# Patient Record
Sex: Female | Born: 1945 | Race: White | Hispanic: No | Marital: Married | Smoking: Former smoker
Health system: Southern US, Community
[De-identification: ages and names within clinical notes are randomized; demographics above are authoritative.]

## PROBLEM LIST (undated history)

## (undated) DIAGNOSIS — M8580 Other specified disorders of bone density and structure, unspecified site: Secondary | ICD-10-CM

## (undated) DIAGNOSIS — C50919 Malignant neoplasm of unspecified site of unspecified female breast: Secondary | ICD-10-CM

## (undated) DIAGNOSIS — I10 Essential (primary) hypertension: Secondary | ICD-10-CM

## (undated) DIAGNOSIS — M810 Age-related osteoporosis without current pathological fracture: Secondary | ICD-10-CM

## (undated) HISTORY — PX: BREAST LUMPECTOMY: SHX2

---

## 2011-10-18 ENCOUNTER — Emergency Department (HOSPITAL_BASED_OUTPATIENT_CLINIC_OR_DEPARTMENT_OTHER)
Admission: EM | Admit: 2011-10-18 | Discharge: 2011-10-18 | Disposition: A | Discharge status: Home / Self Care | Payer: Medicare Other | Attending: Emergency Medicine | Admitting: Emergency Medicine

## 2011-10-18 ENCOUNTER — Encounter (HOSPITAL_BASED_OUTPATIENT_CLINIC_OR_DEPARTMENT_OTHER): Payer: Self-pay | Admitting: *Deleted

## 2011-10-18 DIAGNOSIS — Z853 Personal history of malignant neoplasm of breast: Secondary | ICD-10-CM | POA: Insufficient documentation

## 2011-10-18 DIAGNOSIS — A088 Other specified intestinal infections: Secondary | ICD-10-CM | POA: Insufficient documentation

## 2011-10-18 DIAGNOSIS — A084 Viral intestinal infection, unspecified: Secondary | ICD-10-CM

## 2011-10-18 DIAGNOSIS — K089 Disorder of teeth and supporting structures, unspecified: Secondary | ICD-10-CM | POA: Insufficient documentation

## 2011-10-18 DIAGNOSIS — R10819 Abdominal tenderness, unspecified site: Secondary | ICD-10-CM | POA: Insufficient documentation

## 2011-10-18 DIAGNOSIS — R109 Unspecified abdominal pain: Secondary | ICD-10-CM | POA: Insufficient documentation

## 2011-10-18 DIAGNOSIS — J3489 Other specified disorders of nose and nasal sinuses: Secondary | ICD-10-CM | POA: Insufficient documentation

## 2011-10-18 DIAGNOSIS — R197 Diarrhea, unspecified: Secondary | ICD-10-CM | POA: Insufficient documentation

## 2011-10-18 DIAGNOSIS — R51 Headache: Secondary | ICD-10-CM | POA: Insufficient documentation

## 2011-10-18 DIAGNOSIS — R42 Dizziness and giddiness: Secondary | ICD-10-CM | POA: Insufficient documentation

## 2011-10-18 DIAGNOSIS — Z79899 Other long term (current) drug therapy: Secondary | ICD-10-CM | POA: Insufficient documentation

## 2011-10-18 DIAGNOSIS — R112 Nausea with vomiting, unspecified: Secondary | ICD-10-CM | POA: Insufficient documentation

## 2011-10-18 HISTORY — DX: Other specified disorders of bone density and structure, unspecified site: M85.80

## 2011-10-18 HISTORY — DX: Malignant neoplasm of unspecified site of unspecified female breast: C50.919

## 2011-10-18 LAB — COMPREHENSIVE METABOLIC PANEL
AST: 21 U/L (ref 0–37)
Albumin: 3.7 g/dL (ref 3.5–5.2)
Alkaline Phosphatase: 40 U/L (ref 39–117)
BUN: 14 mg/dL (ref 6–23)
CO2: 27 mEq/L (ref 19–32)
Chloride: 102 mEq/L (ref 96–112)
GFR calc non Af Amer: 75 mL/min — ABNORMAL LOW (ref >90)
Potassium: 3.4 mEq/L — ABNORMAL LOW (ref 3.5–5.1)
Total Bilirubin: 0.4 mg/dL (ref 0.3–1.2)

## 2011-10-18 LAB — URINALYSIS, ROUTINE W REFLEX MICROSCOPIC
Ketones, ur: NEGATIVE mg/dL
Leukocytes, UA: NEGATIVE
Nitrite: NEGATIVE
Protein, ur: NEGATIVE mg/dL
Urobilinogen, UA: 0.2 mg/dL (ref 0.0–1.0)

## 2011-10-18 LAB — CBC
MCH: 30.7 pg (ref 26.0–34.0)
MCV: 88.6 fL (ref 78.0–100.0)
Platelets: 236 10*3/uL (ref 150–400)
RDW: 13.9 % (ref 11.5–15.5)
WBC: 6.5 10*3/uL (ref 4.0–10.5)

## 2011-10-18 LAB — DIFFERENTIAL
Basophils Absolute: 0 10*3/uL (ref 0.0–0.1)
Eosinophils Absolute: 0 10*3/uL (ref 0.0–0.7)
Eosinophils Relative: 0 % (ref 0–5)

## 2011-10-18 MED ORDER — POTASSIUM CHLORIDE CRYS ER 20 MEQ PO TBCR
20.0000 meq | EXTENDED_RELEASE_TABLET | Freq: Once | ORAL | Status: AC
Start: 2011-10-18 — End: 2011-10-18
  Administered 2011-10-18: 20 meq via ORAL
  Filled 2011-10-18: qty 1

## 2011-10-18 MED ORDER — IBUPROFEN 200 MG PO TABS
200.0000 mg | ORAL_TABLET | Freq: Four times a day (QID) | ORAL | Status: DC | PRN
  Administered 2011-10-18: 200 mg via ORAL
  Filled 2011-10-18: qty 1

## 2011-10-18 MED ORDER — ONDANSETRON 4 MG PO TBDP: 4.0000 mg | ORAL_TABLET | Freq: Three times a day (TID) | ORAL | Status: DC | PRN

## 2011-10-18 MED ORDER — ONDANSETRON 4 MG PO TBDP: 4.0000 mg | ORAL_TABLET | Freq: Three times a day (TID) | ORAL | Status: AC | PRN

## 2011-10-18 MED ORDER — SODIUM CHLORIDE 0.9 % IV BOLUS (SEPSIS)
1000.0000 mL | Freq: Once | INTRAVENOUS | Status: AC
  Administered 2011-10-18: 1000 mL via INTRAVENOUS

## 2011-10-18 MED ORDER — ONDANSETRON HCL 4 MG/2ML IJ SOLN
4.0000 mg | Freq: Once | INTRAMUSCULAR | Status: AC
  Administered 2011-10-18: 4 mg via INTRAVENOUS
  Filled 2011-10-18: qty 2

## 2011-10-18 NOTE — Discharge Instructions (Signed)
1. Your symptoms are most likely caused by viral gastroenteritis. Please take Zofran for Nausea. 2. If you have worsening of your symptoms or new symptoms arise, please go to the ER immediately if symptoms are severe.  Diet for Diarrhea, Adult Having frequent, runny stools (diarrhea) has many causes. Diarrhea may be caused or worsened by food or drink. Diarrhea may be relieved by changing your diet. IF YOU ARE NOT TOLERATING SOLID FOODS:  Drink enough water and fluids to keep your urine clear or pale yellow.   Avoid sugary drinks and sodas as well as milk-based beverages.   Avoid beverages containing caffeine and alcohol.   You may try rehydrating beverages. You can make your own by following this recipe:    tsp table salt.    tsp baking soda.   ? tsp salt substitute (potassium chloride).   1 tbs + 1 tsp sugar.   1 qt water.  As your stools become more solid, you can start eating solid foods. Add foods one at a time. If a certain food causes your diarrhea to get worse, avoid that food and try other foods. A low fiber, low-fat, and lactose-free diet is recommended. Small, frequent meals may be better tolerated.  Starches  Allowed:  White, [place], and pita breads, plain rolls, buns, bagels. Plain muffins, matzo. Soda, saltine, or graham crackers. Pretzels, melba toast, zwieback. Cooked cereals made with water: cornmeal, farina, cream cereals. Dry cereals: refined corn, wheat, rice. Potatoes prepared any way without skins, refined macaroni, spaghetti, noodles, refined rice.   Avoid:  Bread, rolls, or crackers made with whole wheat, multi-grains, rye, bran seeds, nuts, or coconut. Corn tortillas or taco shells. Cereals containing whole grains, multi-grains, bran, coconut, nuts, or raisins. Cooked or dry oatmeal. Coarse wheat cereals, granola. Cereals advertised as "high-fiber." Potato skins. Whole grain pasta, wild or brown rice. Popcorn. Sweet potatoes/yams. Sweet rolls, doughnuts,  waffles, pancakes, sweet breads.  Vegetables  Allowed: Strained tomato and vegetable juices. Most well-cooked and canned vegetables without seeds. Fresh: Tender lettuce, cucumber without the skin, cabbage, spinach, bean sprouts.   Avoid: Fresh, cooked, or canned: Artichokes, baked beans, beet greens, broccoli, Brussels sprouts, corn, kale, legumes, peas, sweet potatoes. Cooked: Green or red cabbage, spinach. Avoid large servings of any vegetables, because vegetables shrink when cooked, and they contain more fiber per serving than fresh vegetables.  Fruit  Allowed: All fruit juices except prune juice. Cooked or canned: Apricots, applesauce, cantaloupe, cherries, fruit cocktail, grapefruit, grapes, kiwi, mandarin oranges, peaches, pears, plums, watermelon. Fresh: Apples without skin, ripe banana, grapes, cantaloupe, cherries, grapefruit, peaches, oranges, plums. Keep servings limited to  cup or 1 piece.   Avoid: Fresh: Apple with skin, apricots, mango, pears, raspberries, strawberries. Prune juice, stewed or dried prunes. Dried fruits, raisins, dates. Large servings of all fresh fruits.  Meat and Meat Substitutes  Allowed: Ground or well-cooked tender beef, ham, veal, lamb, pork, or poultry. Eggs, plain cheese. Fish, oysters, shrimp, lobster, other seafoods. Liver, organ meats.   Avoid: Tough, fibrous meats with gristle. Peanut butter, smooth or chunky. Cheese, nuts, seeds, legumes, dried peas, beans, lentils.  Milk  Allowed: Yogurt, lactose-free milk, kefir, drinkable yogurt, buttermilk, soy milk.   Avoid: Milk, chocolate milk, beverages made with milk, such as milk shakes.  Soups  Allowed: Bouillon, broth, or soups made from allowed foods. Any strained soup.   Avoid: Soups made from vegetables that are not allowed, cream or milk-based soups.  Desserts and Sweets  Allowed: Sugar-free gelatin, sugar-free frozen  ice pops made without sugar alcohol.   Avoid: Plain cakes and cookies, pie  made with allowed fruit, pudding, custard, cream pie. Gelatin, fruit, ice, sherbet, frozen ice pops. Ice cream, ice milk without nuts. Plain hard candy, honey, jelly, molasses, syrup, sugar, chocolate syrup, gumdrops, marshmallows.  Fats and Oils  Allowed: Avoid any fats and oils.   Avoid: Seeds, nuts, olives, avocados. Margarine, butter, cream, mayonnaise, salad oils, plain salad dressings made from allowed foods. Plain gravy, crisp bacon without rind.  Beverages  Allowed: Water, decaffeinated teas, oral rehydration solutions, sugar-free beverages.   Avoid: Fruit juices, caffeinated beverages (coffee, tea, soda or pop), alcohol, sports drinks, or lemon-lime soda or pop.  Condiments  Allowed: Ketchup, mustard, horseradish, vinegar, cream sauce, cheese sauce, cocoa powder. Spices in moderation: allspice, basil, bay leaves, celery powder or leaves, cinnamon, cumin powder, curry powder, ginger, mace, marjoram, onion or garlic powder, oregano, paprika, parsley flakes, ground pepper, rosemary, sage, savory, tarragon, thyme, turmeric.   Avoid: Coconut, honey.  Weight Monitoring: Weigh yourself every day. You should weigh yourself in the morning after you urinate and before you eat breakfast. Wear the same amount of clothing when you weigh yourself. Record your weight daily. Bring your recorded weights to your clinic visits. Tell your caregiver right away if you have gained 3 lb/1.4 kg or more in 1 day, 5 lb/2.3 kg in a week, or whatever amount you were told to report. SEEK IMMEDIATE MEDICAL CARE IF:   You are unable to keep fluids down.   You start to throw up (vomit) or diarrhea keeps coming back (persistent).   Abdominal pain develops, increases, or can be felt in one place (localizes).   You have an oral temperature above 102 F (38.9 C), not controlled by medicine.   Diarrhea contains blood or mucus.   You develop excessive weakness, dizziness, fainting, or extreme thirst.  MAKE SURE  YOU:   Understand these instructions.   Will watch your condition.   Will get help right away if you are not doing well or get worse.  Document Released: 10/10/2003 Document Revised: 07/09/2011 Document Reviewed: 01/31/2009 [facility] Patient Information 2012 [place], [place].

## 2011-10-18 NOTE — ED Notes (Signed)
Reports diarrhea ~ 15 times since 0100- n/v x 3 episodes- also c/o headache "under my eyes and in my teeth"

## 2011-10-18 NOTE — ED Notes (Signed)
MD at bedside. 

## 2011-10-18 NOTE — ED Provider Notes (Signed)
History     CSN: [id number]  Arrival date & time 10/18/11  1633   First MD Initiated Contact with Patient 10/18/11 1714      Chief Complaint  Patient presents with  . Diarrhea  . Emesis    (Consider location/radiation/quality/duration/timing/severity/associated sxs/prior treatment) HPI  Patient is 66 yo lady with PMH of breast cancer and s/p of lumpectomy in 2007, who presents with nausea, vomiting and abdominal pain. Per patient, her symptoms started at about 1:00 PM today. She vomited several times brownish materials without blood in it. She has watery diarrhea almost every hour. Her abdominal pain is mild and diffuse. She doses not have fever, but has chills. She has mild lightheadedness, no dizziness. Had one episode of palpitation.   Patient has chronic headache which is located under her eyes. She also has chronic sinus pain and pain in her front teeth. These two issues have not changed in nature recently.   Denies cough, chest pain, SOB, dysuria, urgency, frequency, hematuria, joint pain or leg swelling.   Past Medical History  Diagnosis Date  . Breast cancer   . Osteopenia     Past Surgical History  Procedure Date  . Breast lumpectomy     No family history on file.  History  Substance Use Topics  . Smoking status: Former Games developer  . Smokeless tobacco: Never Used  . Alcohol Use: Not on file    OB History    Grav Para Term Preterm Abortions TAB SAB Ect Mult Living                  Review of Systems  Constitutional: Positive for chills. Negative for fever, diaphoresis and fatigue.  HENT: Negative for ear pain, congestion, sore throat, facial swelling, rhinorrhea, sneezing, drooling, trouble swallowing, neck pain, voice change and ear discharge.   Eyes: Negative for pain, discharge and visual disturbance.  Respiratory: Negative for apnea, cough, choking, chest tightness, wheezing and stridor.   Cardiovascular: Negative for chest pain and leg swelling.    Gastrointestinal: Positive for nausea, vomiting, abdominal pain and diarrhea. Negative for constipation, blood in stool and abdominal distention.  Genitourinary: Negative for dysuria, urgency and frequency.  Musculoskeletal: Negative for back pain, joint swelling, arthralgias and gait problem.  Skin: Negative for pallor and wound.  Neurological: Positive for light-headedness and headaches. Negative for dizziness, tremors, seizures, syncope, facial asymmetry, weakness and numbness.  Hematological: Negative for adenopathy. Does not bruise/bleed easily.  Psychiatric/Behavioral: Negative for hallucinations, confusion and agitation.    Allergies  Review of patient's allergies indicates no known allergies.  Home Medications   Current Outpatient Rx  Name Route Sig Dispense Refill  . VITAMIN C PO Oral Take 1 tablet by mouth daily.    . ASPIRIN-ACETAMINOPHEN-CAFFEINE 250-250-65 MG PO TABS Oral Take 1 tablet by mouth every 6 (six) hours as needed. Patient used this medication for pain.    Marland Kitchen CINNAMON PO Oral Take 1 tablet by mouth daily.    . IBANDRONATE SODIUM 150 MG PO TABS Oral Take 150 mg by mouth every 30 (thirty) days. Take in the morning with a full glass of water, on an empty stomach, and do not take anything else by mouth or lie down for the next 30 min.    . IBUPROFEN 200 MG PO TABS Oral Take 200 mg by mouth every 6 (six) hours as needed. Patient used this medication for pain.    Marland Kitchen ONE-DAILY MULTI VITAMINS PO TABS Oral Take 1 tablet by mouth daily.    Marland Kitchen  OLIVE OIL OIL Topical Apply topically as needed.    Marland Kitchen FISH OIL PO Oral Take 1 capsule by mouth daily.    Marland Kitchen ONDANSETRON 4 MG PO TBDP Oral Take 1 tablet (4 mg total) by mouth every 8 (eight) hours as needed for nausea. 24 tablet 0    BP 129/73  Pulse 88  Temp(Src) 98.2 F (36.8 C) (Oral)  Resp 20  SpO2 96%  Physical Exam  General: resting in bed, not in acute distress HEENT: PERRL, EOMI, no scleral icterus. Mild tenderness over  maxillary area bilaterally.  Cardiac: S1/S2, RRR, No murmurs, gallops or rubs Pulm: Good air movement bilaterally, Clear to auscultation bilaterally, No rales, wheezing, rhonchi or rubs. Abd: Soft,  nondistended, mild diffused tenderness,  no rebound pain, no organomegaly, BS present Ext: No rashes or edema, 2+DP/PT pulse bilaterally Neuro: alert and oriented X3, cranial nerves II-XII grossly intact, muscle strength 5/5 in all extremeties,  sensation to light touch intact.   ED Course  Procedures (including critical care time)   Patient's symptoms are most likely caused by viral gastroenteritis. Patient was treated with IV bolus NS 1000 ml and Zofran for nausea. He feels better and wants to go home. Her CBC and UA do not have significant abnormalities. Her CMP showed K 3.4. Patient was given 20 mEq of KCl by mouth. She will be discharged home on Zofran. She is instructed to come back if symptoms get worse.    Labs Reviewed  COMPREHENSIVE METABOLIC PANEL - Abnormal; Notable for the following:    Potassium 3.4 (*)    Glucose, Bld 119 (*)    GFR calc non Af Amer 75 (*)    GFR calc Af Amer 86 (*)    All other components within normal limits  CBC - Abnormal; Notable for the following:    Hemoglobin 15.1 (*)    All other components within normal limits  DIFFERENTIAL - Abnormal; Notable for the following:    Neutrophils Relative 81 (*)    Lymphocytes Relative 10 (*)    All other components within normal limits  URINALYSIS, ROUTINE W REFLEX MICROSCOPIC - Abnormal; Notable for the following:    Hgb urine dipstick SMALL (*)    All other components within normal limits  URINE MICROSCOPIC-ADD ON - Abnormal; Notable for the following:    Squamous Epithelial / LPF FEW (*)    Bacteria, UA FEW (*)    All other components within normal limits   No results found.   1. Viral gastroenteritis       MDM     Lorretta Harp, MD 10/18/11 1931

## 2011-10-19 NOTE — ED Provider Notes (Signed)
I saw and evaluated the patient, reviewed the resident's note and I agree with the findings and plan. Pt w onset nvd. No abd tenderness. ivf in ed. Feels improved.   Suzi Roots, MD 10/19/11 [phone]

## 2016-01-31 ENCOUNTER — Encounter (HOSPITAL_BASED_OUTPATIENT_CLINIC_OR_DEPARTMENT_OTHER): Payer: Self-pay | Admitting: *Deleted

## 2016-01-31 ENCOUNTER — Emergency Department (HOSPITAL_BASED_OUTPATIENT_CLINIC_OR_DEPARTMENT_OTHER)
Admission: EM | Admit: 2016-01-31 | Discharge: 2016-01-31 | Disposition: A | Discharge status: Home / Self Care | Payer: Medicare Other | Attending: Emergency Medicine | Admitting: Emergency Medicine

## 2016-01-31 ENCOUNTER — Emergency Department (HOSPITAL_BASED_OUTPATIENT_CLINIC_OR_DEPARTMENT_OTHER): Payer: Medicare Other

## 2016-01-31 DIAGNOSIS — R197 Diarrhea, unspecified: Secondary | ICD-10-CM | POA: Diagnosis not present

## 2016-01-31 DIAGNOSIS — Z79899 Other long term (current) drug therapy: Secondary | ICD-10-CM | POA: Insufficient documentation

## 2016-01-31 DIAGNOSIS — I10 Essential (primary) hypertension: Secondary | ICD-10-CM | POA: Diagnosis not present

## 2016-01-31 DIAGNOSIS — R55 Syncope and collapse: Secondary | ICD-10-CM

## 2016-01-31 DIAGNOSIS — Z853 Personal history of malignant neoplasm of breast: Secondary | ICD-10-CM | POA: Insufficient documentation

## 2016-01-31 DIAGNOSIS — Z87891 Personal history of nicotine dependence: Secondary | ICD-10-CM | POA: Insufficient documentation

## 2016-01-31 DIAGNOSIS — R109 Unspecified abdominal pain: Secondary | ICD-10-CM | POA: Insufficient documentation

## 2016-01-31 HISTORY — DX: Essential (primary) hypertension: I10

## 2016-01-31 HISTORY — DX: Age-related osteoporosis without current pathological fracture: M81.0

## 2016-01-31 LAB — URINE MICROSCOPIC-ADD ON

## 2016-01-31 LAB — URINALYSIS, ROUTINE W REFLEX MICROSCOPIC
Bilirubin Urine: NEGATIVE
Glucose, UA: NEGATIVE mg/dL
Ketones, ur: NEGATIVE mg/dL
Leukocytes, UA: NEGATIVE
Nitrite: NEGATIVE
Protein, ur: NEGATIVE mg/dL
Specific Gravity, Urine: 1.01 (ref 1.005–1.030)
pH: 6 (ref 5.0–8.0)

## 2016-01-31 LAB — D-DIMER, QUANTITATIVE (NOT AT ARMC): D DIMER QUANT: 0.27 ug{FEU}/mL (ref 0.00–0.50)

## 2016-01-31 LAB — COMPREHENSIVE METABOLIC PANEL
ALK PHOS: 38 U/L (ref 38–126)
ALT: 18 U/L (ref 14–54)
AST: 27 U/L (ref 15–41)
Albumin: 4.2 g/dL (ref 3.5–5.0)
Anion gap: 10 (ref 5–15)
BILIRUBIN TOTAL: 0.7 mg/dL (ref 0.3–1.2)
BUN: 19 mg/dL (ref 6–20)
CALCIUM: 9.3 mg/dL (ref 8.9–10.3)
CO2: 25 mmol/L (ref 22–32)
CREATININE: 1.01 mg/dL — AB (ref 0.44–1.00)
Chloride: 104 mmol/L (ref 101–111)
GFR, EST NON AFRICAN AMERICAN: 55 mL/min — AB (ref >60)
Glucose, Bld: 102 mg/dL — ABNORMAL HIGH (ref 65–99)
Potassium: 3.3 mmol/L — ABNORMAL LOW (ref 3.5–5.1)
Sodium: 139 mmol/L (ref 135–145)
TOTAL PROTEIN: 7.4 g/dL (ref 6.5–8.1)

## 2016-01-31 LAB — CBC WITH DIFFERENTIAL/PLATELET
BASOS ABS: 0 10*3/uL (ref 0.0–0.1)
BASOS PCT: 0 %
EOS ABS: 0.1 10*3/uL (ref 0.0–0.7)
Eosinophils Relative: 1 %
HEMATOCRIT: 44.3 % (ref 36.0–46.0)
Hemoglobin: 15.1 g/dL — ABNORMAL HIGH (ref 12.0–15.0)
Lymphocytes Relative: 13 %
Lymphs Abs: 1.4 10*3/uL (ref 0.7–4.0)
MCH: 31.2 pg (ref 26.0–34.0)
MCHC: 34.1 g/dL (ref 30.0–36.0)
MCV: 91.5 fL (ref 78.0–100.0)
MONO ABS: 0.8 10*3/uL (ref 0.1–1.0)
MONOS PCT: 8 %
NEUTROS ABS: 7.9 10*3/uL — AB (ref 1.7–7.7)
NEUTROS PCT: 78 %
Platelets: 218 10*3/uL (ref 150–400)
RBC: 4.84 MIL/uL (ref 3.87–5.11)
RDW: 13.5 % (ref 11.5–15.5)
WBC: 10.2 10*3/uL (ref 4.0–10.5)

## 2016-01-31 LAB — TROPONIN I

## 2016-01-31 LAB — CBG MONITORING, ED: Glucose-Capillary: 113 mg/dL — ABNORMAL HIGH (ref 65–99)

## 2016-01-31 MED ORDER — SODIUM CHLORIDE 0.9 % IV BOLUS (SEPSIS)
1000.0000 mL | Freq: Once | INTRAVENOUS | Status: AC
  Administered 2016-01-31: 1000 mL via INTRAVENOUS

## 2016-01-31 MED ORDER — SODIUM CHLORIDE 0.9 % IV SOLN: INTRAVENOUS | Status: DC

## 2016-01-31 NOTE — Discharge Instructions (Signed)
Syncope °Syncope means a person passes out (faints). The person usually wakes up in less than 5 minutes. It is important to seek medical care for syncope. °HOME CARE °· Have someone stay with you until you feel normal. °· Do not drive, use machines, or play sports until your doctor says it is okay. °· Keep all doctor visits as told. °· Lie down when you feel like you might pass out. Take deep breaths. Wait until you feel normal before standing up. °· Drink enough fluids to keep your pee (urine) clear or pale yellow. °· If you take blood pressure or heart medicine, get up slowly. Take several minutes to sit and then stand. °GET HELP RIGHT AWAY IF:  °· You have a severe headache. °· You have pain in the chest, belly (abdomen), or back. °· You are bleeding from the mouth or butt (rectum). °· You have black or tarry poop (stool). °· You have an irregular or very fast heartbeat. °· You have pain with breathing. °· You keep passing out, or you have shaking (seizures) when you pass out. °· You pass out when sitting or lying down. °· You feel confused. °· You have trouble walking. °· You have severe weakness. °· You have vision problems. °If you fainted, call for help (911 in [place] Do not drive yourself to the hospital. °  °This information is not intended to replace advice given to you by your health care provider. Make sure you discuss any questions you have with your health care provider. °  °Document Released: 01/06/2008 Document Revised: 12/04/2014 Document Reviewed: 09/18/2011 °Elsevier Interactive Patient Education ©2016 [employer]vier Inc. ° °

## 2016-01-31 NOTE — ED Notes (Signed)
Family at bedside. 

## 2016-01-31 NOTE — ED Notes (Signed)
Pt walked to bathroom and back with no reports of dizziness , steady gait.

## 2016-01-31 NOTE — ED Notes (Signed)
Patient transported to CT 

## 2016-01-31 NOTE — ED Provider Notes (Signed)
CSN: [id number]     Arrival date & time 01/31/16  1441 History   First MD Initiated Contact with Patient 01/31/16 1454     Chief Complaint  Patient presents with  . Near Syncope  PT IS A 70 YO WF HERE FROM [place] VISITING RELATIVES.  THE PT SAID THAT SHE HAD JUST ARRIVED AFTER DRIVING FOR ABOUT 2 HRS.  SHE WAS IN THE KITCHEN TALKING TO HER FAMILY, FELT SUDDEN ABDOMINAL PAIN AND DIZZINESS.  SHE MAY HAVE PASSED OUT FOR A FEW SECONDS.  AFTERWARDS, SHE WENT TO THE BATHROOM AND HAD A LOT OF DIARRHEA.  PT'S SBP WAS LOW WHEN EMS ARRIVED.  PT FEELS BETTER NOW.  NO MORE ABDOMINAL PAIN.   (Consider location/radiation/quality/duration/timing/severity/associated sxs/prior Treatment) Patient is a 70 y.o. female presenting with near-syncope. The history is provided by the patient.  Near Syncope This is a new problem. The current episode started less than 1 hour ago. The problem has been rapidly improving. Associated symptoms include abdominal pain. Nothing aggravates the symptoms.    Past Medical History  Diagnosis Date  . Breast cancer ([place])   . Osteopenia   . Hypertension   . Osteoporosis    Past Surgical History  Procedure Laterality Date  . Breast lumpectomy     No family history on file. Social History  Substance Use Topics  . Smoking status: Former Research scientist (life sciences)  . Smokeless tobacco: Never Used  . Alcohol Use: 0.6 oz/week    1 Glasses of wine per week   OB History    No data available     Review of Systems  Cardiovascular: Positive for near-syncope.  Gastrointestinal: Positive for abdominal pain and diarrhea.  Neurological: Positive for dizziness and syncope.  All other systems reviewed and are negative.     Allergies  Sulfa antibiotics  Home Medications   Prior to Admission medications   Medication Sig Start Date End Date Taking? Authorizing Provider  losartan-hydrochlorothiazide (HYZAAR) 50-12.5 MG tablet Take 1 tablet by mouth daily.   Yes Historical Provider, MD   Ascorbic Acid (VITAMIN C PO) Take 1 tablet by mouth daily.    Historical Provider, MD  aspirin-acetaminophen-caffeine (EXCEDRIN MIGRAINE) [phone] MG per tablet Take 1 tablet by mouth every 6 (six) hours as needed. Patient used this medication for pain.    Historical Provider, MD  CINNAMON PO Take 1 tablet by mouth daily.    Historical Provider, MD  ibandronate (BONIVA) 150 MG tablet Take 150 mg by mouth every 30 (thirty) days. Take in the morning with a full glass of water, on an empty stomach, and do not take anything else by mouth or lie down for the next 30 min.    Historical Provider, MD  ibuprofen (ADVIL,MOTRIN) 200 MG tablet Take 200 mg by mouth every 6 (six) hours as needed. Patient used this medication for pain.    Historical Provider, MD  Multiple Vitamin (MULTIVITAMIN) tablet Take 1 tablet by mouth daily.    Historical Provider, MD  olive oil external oil Apply topically as needed.    Historical Provider, MD  Omega-3 Fatty Acids (FISH OIL PO) Take 1 capsule by mouth daily.    Historical Provider, MD   BP 134/79 mmHg  Pulse 79  Temp(Src) 97.6 F (36.4 C) (Oral)  Resp 18  Ht 5\' 3"  (1.6 m)  Wt 155 lb (70.308 kg)  BMI 27.46 kg/m2  SpO2 100% Physical Exam  Constitutional: She is oriented to person, place, and time. She appears well-developed and well-nourished.  HENT:  Head: Normocephalic and atraumatic.  Right Ear: External ear normal.  Left Ear: External ear normal.  Nose: Nose normal.  Mouth/Throat: Oropharynx is clear and moist.  Eyes: Conjunctivae and EOM are normal. Pupils are equal, round, and reactive to light.  Neck: Normal range of motion. Neck supple.  Cardiovascular: Normal rate, regular rhythm, normal heart sounds and intact distal pulses.   Pulmonary/Chest: Effort normal and breath sounds normal.  Abdominal: Soft. Bowel sounds are normal.  Musculoskeletal: Normal range of motion.  Neurological: She is alert and oriented to person, place, and time.  Skin: Skin  is warm and dry.  Psychiatric: She has a normal mood and affect. Her behavior is normal. Judgment and thought content normal.  Nursing note and vitals reviewed.   ED Course  Procedures (including critical care time) Labs Review Labs Reviewed  CBC WITH DIFFERENTIAL/PLATELET - Abnormal; Notable for the following:    Hemoglobin 15.1 (*)    Neutro Abs 7.9 (*)    All other components within normal limits  COMPREHENSIVE METABOLIC PANEL - Abnormal; Notable for the following:    Potassium 3.3 (*)    Glucose, Bld 102 (*)    Creatinine, Ser 1.01 (*)    GFR calc non Af Amer 55 (*)    All other components within normal limits  URINALYSIS, ROUTINE W REFLEX MICROSCOPIC (NOT AT [facility]) - Abnormal; Notable for the following:    Hgb urine dipstick TRACE (*)    All other components within normal limits  URINE MICROSCOPIC-ADD ON - Abnormal; Notable for the following:    Squamous Epithelial / LPF 0-5 (*)    Bacteria, UA RARE (*)    Casts HYALINE CASTS (*)    All other components within normal limits  CBG MONITORING, ED - Abnormal; Notable for the following:    Glucose-Capillary 113 (*)    All other components within normal limits  TROPONIN I  D-DIMER, QUANTITATIVE (NOT AT [facility])  TROPONIN I  POCT CBG (FASTING - GLUCOSE)-MANUAL ENTRY    Imaging Review Dg Chest 2 View  01/31/2016  CLINICAL DATA:  Sudden onset of dizziness, brief loss of consciousness, history breast cancer, hypertension, former smoker EXAM: CHEST  2 VIEW COMPARISON:  Non FINDINGS: Normal heart size, mediastinal contours, and pulmonary vascularity. Lungs minimally hyperinflated but clear. No infiltrate, pleural effusion, or pneumothorax. Bones unremarkable. IMPRESSION: No acute abnormalities. Electronically Signed   By: Lavonia Dana M.D.   On: 01/31/2016 15:33   Ct Head Wo Contrast  01/31/2016  CLINICAL DATA:  Sudden onset dizziness with focal syncopal episode. History of breast carcinoma EXAM: CT HEAD WITHOUT CONTRAST TECHNIQUE:  Contiguous axial images were obtained from the base of the skull through the vertex without intravenous contrast. COMPARISON:  None. FINDINGS: Brain: The ventricles are normal in size and configuration. There is fairly mild frontal atrophy bilaterally, however. There is no intracranial mass, hemorrhage, extra-axial fluid collection, or midline shift. Gray-white compartments appear normal. No acute infarct evident. Vascular: No vascular calcification or other vascular lesion evident. Skull: Bony calvarium appears intact. Sinuses/Orbits: Visualized orbits appear symmetric bilaterally. There is mucosal thickening in the lateral right sphenoid sinus. Visualized paranasal sinuses elsewhere appear unremarkable. Other: Visualized mastoid air cells are clear. IMPRESSION: Mild frontal atrophy bilaterally. The ventricles appear normal in size and configuration. No intracranial mass, hemorrhage, or focal gray - white compartment lesion. There is right sided sphenoid sinus disease. Electronically Signed   By: Lowella Grip III M.D.   On: 01/31/2016 15:32  I have personally reviewed and evaluated these images and lab results as part of my medical decision-making.   EKG Interpretation   Date/Time:  Friday January 31 2016 14:53:31 EDT Ventricular Rate:  61 PR Interval:    QRS Duration: 107 QT Interval:  440 QTC Calculation: 444 R Axis:   51 Text Interpretation:  Sinus rhythm Low voltage, precordial leads Baseline  wander in lead(s) II III aVF Confirmed by Rossie Bretado MD, Harshaan Whang ([id number]) on  01/31/2016 3:22:22 PM      MDM  After 2 L of IVFs, pt is feeling better.  Pt is instr to return if worse.  Pt to f/u with pcp when she gets home. Final diagnoses:  Vasovagal syncope        Isla Pence, MD 01/31/16 [phone]

## 2016-01-31 NOTE — ED Notes (Signed)
She was standing in the kitchen and almost passed out after sudden onset of abdominal pain, nausea and dizziness. She is visiting family. BM afterward and condition improved.

## 2016-01-31 NOTE — ED Notes (Signed)
Pt states she does not feel any difference after the liter of fluids.

## 2017-06-02 IMAGING — CT CT HEAD W/O CM
3 series · 15 of 45 positions shown, 18 images · non-contrast
Comparison: None.

CLINICAL DATA: Sudden onset dizziness with focal syncopal episode.
History of breast carcinoma

EXAM:
CT HEAD WITHOUT CONTRAST
TECHNIQUE: Contiguous axial images were obtained from the base of the skull
through the vertex without intravenous contrast.

[Series 2: head wo · axial · 0.39mm/px · z∈[-268,-154]mm · 9 of 28 slices shown, 12 images]
[im 3/28  brain]
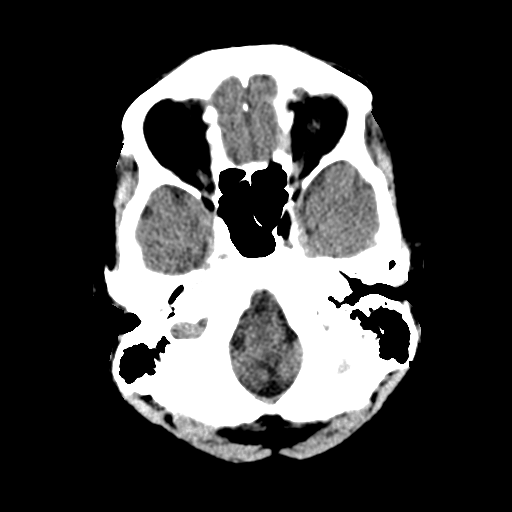
[im 3/28  bone]
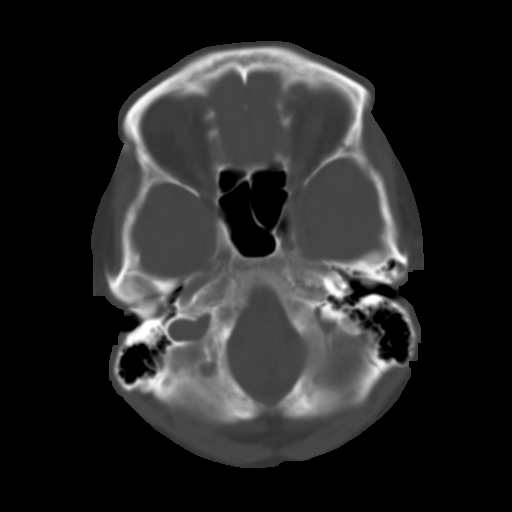
[im 6/28  brain]
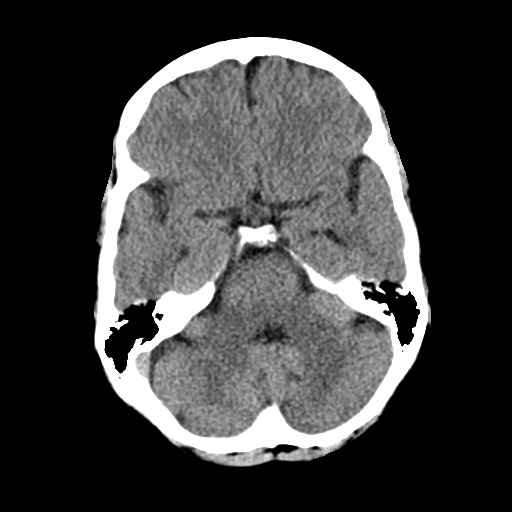
[im 9/28  brain]
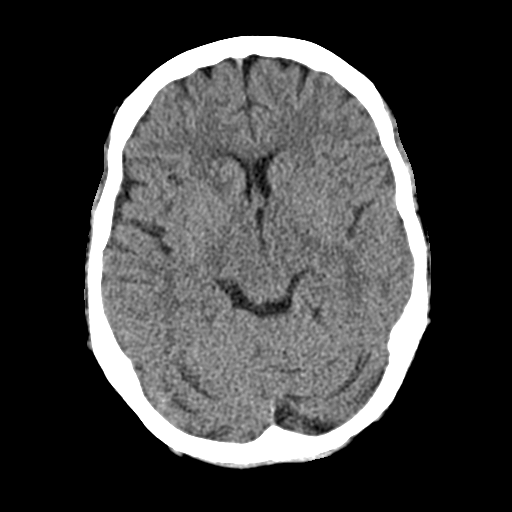
[im 12/28  brain]
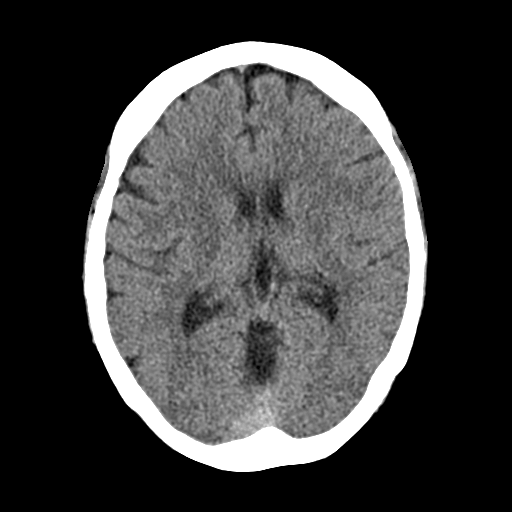
[im 15/28  brain]
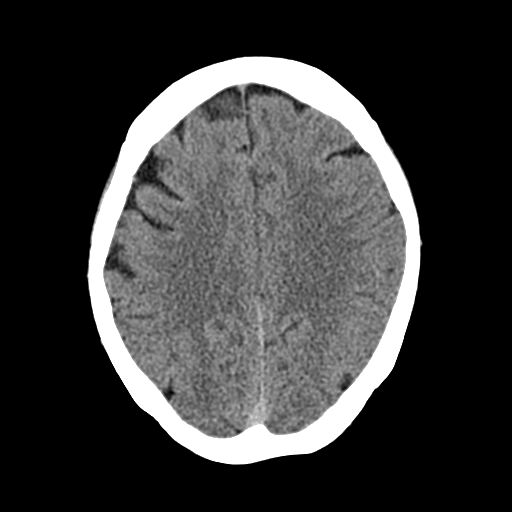
[im 15/28  bone]
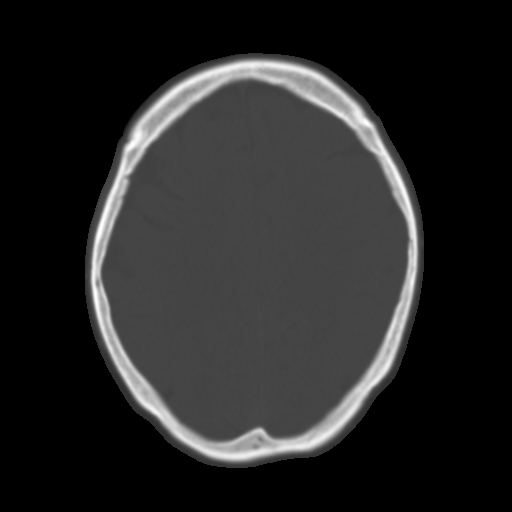
[im 17/28  brain]
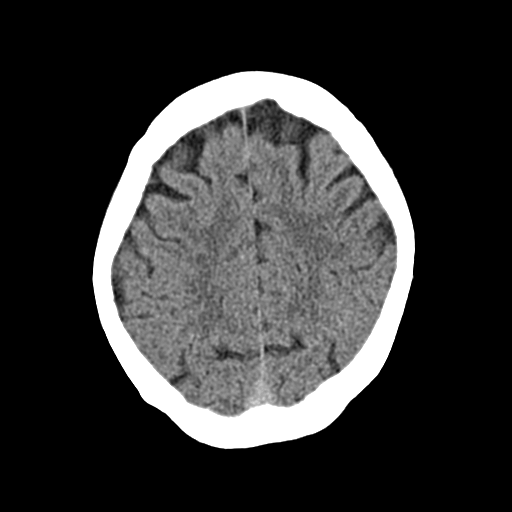
[im 20/28  brain]
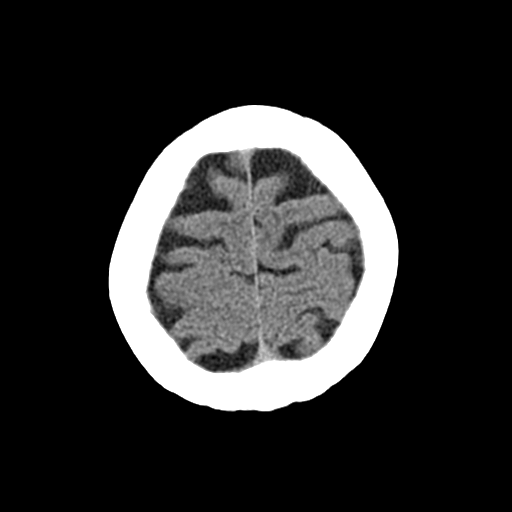
[im 23/28  brain]
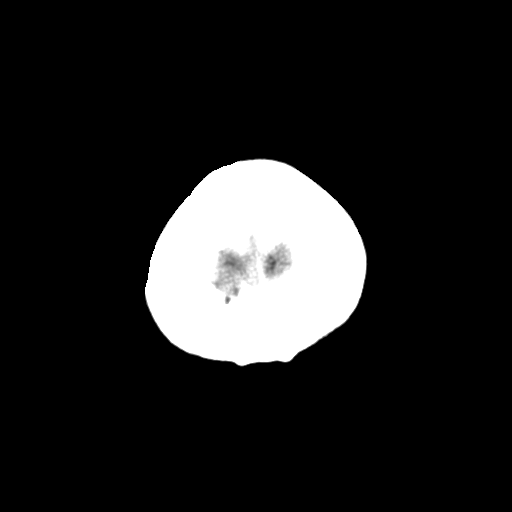
[im 26/28  brain]
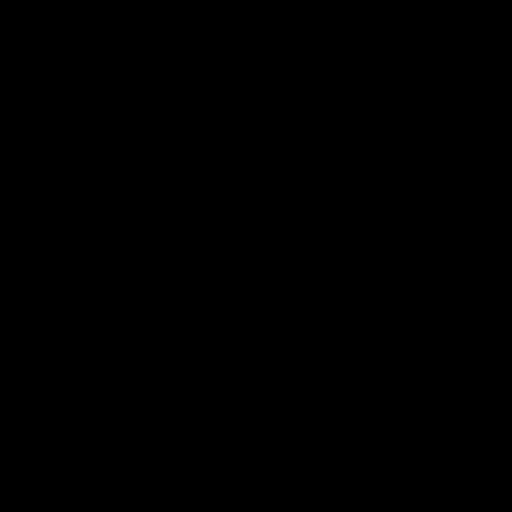
[im 26/28  bone]
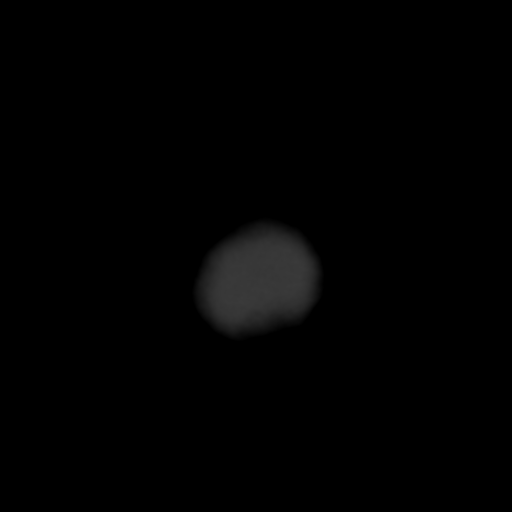

[Series 4: cor soft · coronal · 0.27mm/px · 3 of 62 slices shown]
[im 21/62  brain]
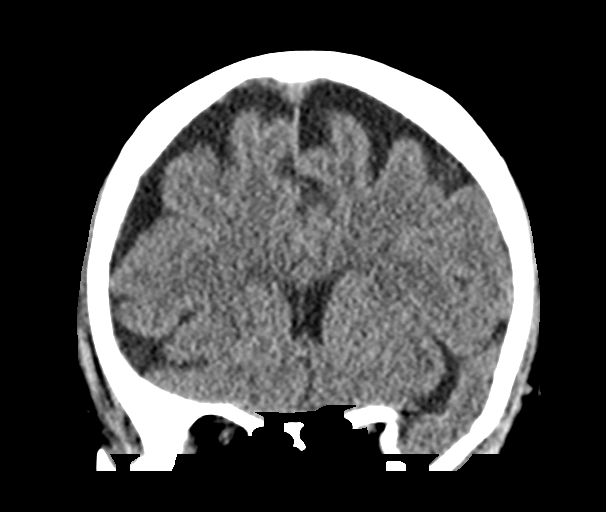
[im 28/62  brain]
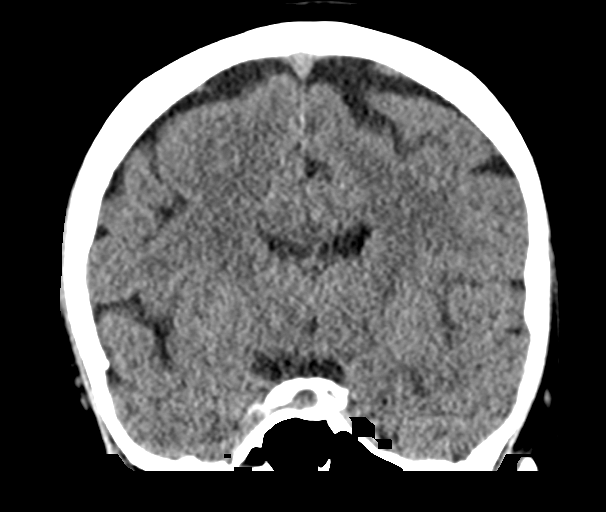
[im 34/62  brain]
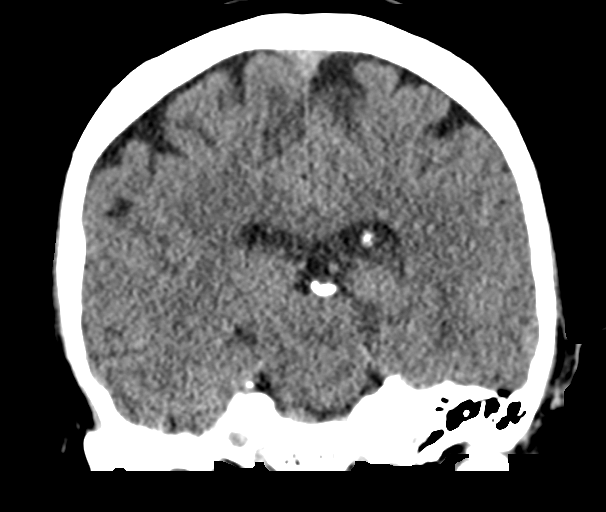

[Series 5: sag soft · sagittal · 0.28mm/px · 3 of 51 slices shown]
[im 17/51  brain]
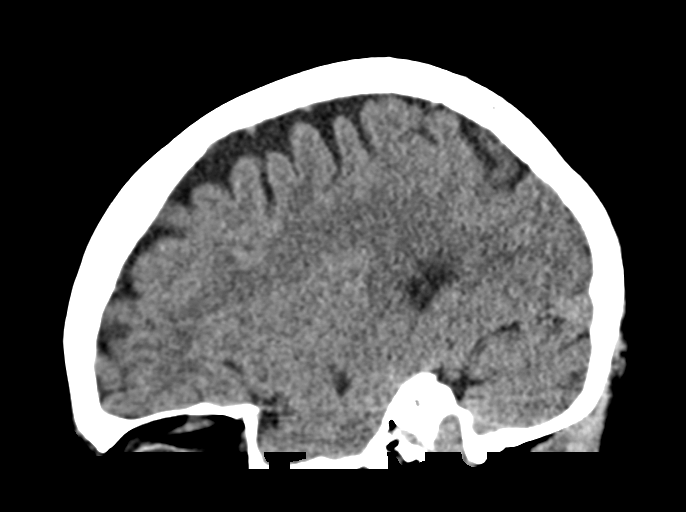
[im 26/51  brain]
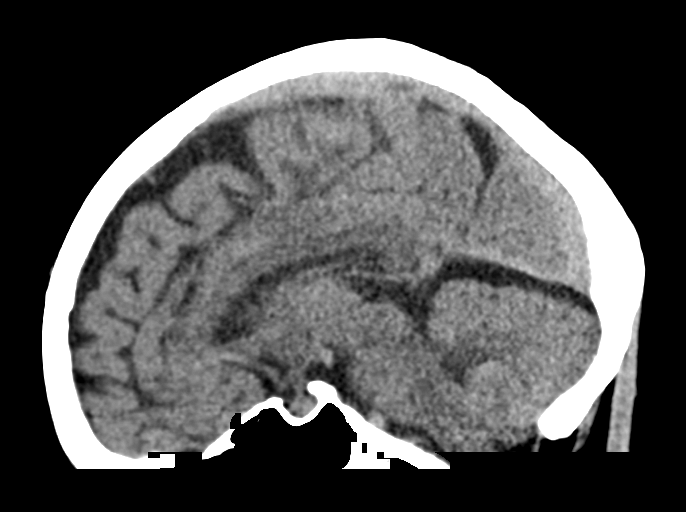
[im 34/51  brain]
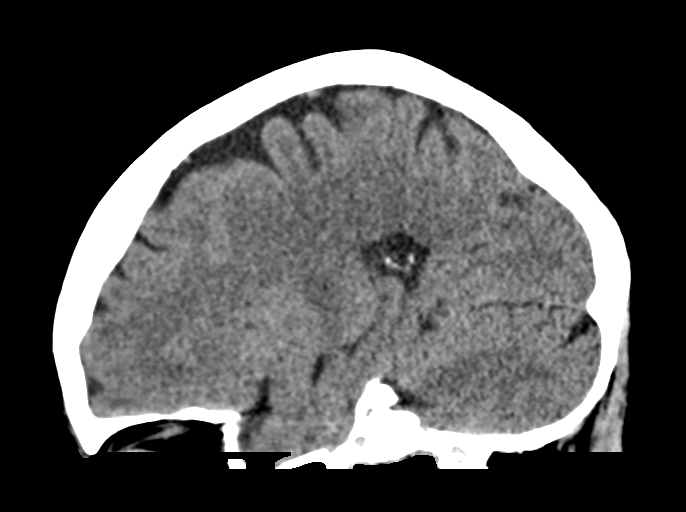

[15 of 45 positions shown; findings below may reference images not displayed]

FINDINGS: Brain: The ventricles are normal in size and configuration. There is
fairly mild frontal atrophy bilaterally, however. There is no
intracranial mass, hemorrhage, extra-axial fluid collection, or
midline shift. Gray-white compartments appear normal. No acute
infarct evident.

Vascular: No vascular calcification or other vascular lesion
evident.

Skull: Bony calvarium appears intact.

Sinuses/Orbits: Visualized orbits appear symmetric bilaterally.
There is mucosal thickening in the lateral right sphenoid sinus.
Visualized paranasal sinuses elsewhere appear unremarkable.

Other: Visualized mastoid air cells are clear.
IMPRESSION: Mild frontal atrophy bilaterally. The ventricles appear normal in
size and configuration. No intracranial mass, hemorrhage, or focal
gray - white compartment lesion. There is right sided sphenoid sinus
disease.
